# Patient Record
Sex: Male | Born: 1995 | Race: Black or African American | Hispanic: No | Marital: Single | Smoking: Current every day smoker
Health system: Southern US, Community
[De-identification: ages and names within clinical notes are randomized; demographics above are authoritative.]

---
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
## 2016-07-18 ENCOUNTER — Ambulatory Visit (INDEPENDENT_AMBULATORY_CARE_PROVIDER_SITE_OTHER): Payer: Managed Care, Other (non HMO) | Admitting: Emergency Medicine

## 2016-07-18 VITALS — BP 110/58 | HR 61 | Temp 99.0°F | Resp 17 | Ht 68.2 in | Wt 184.0 lb

## 2016-07-18 DIAGNOSIS — J060 Acute laryngopharyngitis: Secondary | ICD-10-CM

## 2016-07-18 DIAGNOSIS — J029 Acute pharyngitis, unspecified: Secondary | ICD-10-CM

## 2016-07-18 LAB — POCT RAPID STREP A (OFFICE): Rapid Strep A Screen: NEGATIVE

## 2016-07-18 MED ORDER — AZITHROMYCIN 250 MG PO TABS: ORAL_TABLET | ORAL | 0 refills | Status: DC

## 2016-07-18 NOTE — Progress Notes (Signed)
Rodney Ferguson is a 21 y.o. male presenting with a sore throat for 2 days.  Associated symptoms include:  fever and headache.  Symptoms are constant.  Home treatment thus far includes:  None.  Known sick contacts with similar symptoms.  There is no history of of similar symptoms. Review of Systems  Constitutional: Positive for chills, fever and malaise/fatigue.  HENT: Positive for congestion and sore throat.   Eyes: Negative for discharge and redness.  Respiratory: Negative for cough, hemoptysis and shortness of breath.   Cardiovascular: Negative for chest pain and palpitations.  Gastrointestinal: Negative for nausea and vomiting.  Genitourinary: Negative for dysuria and hematuria.  Musculoskeletal: Negative for myalgias and neck pain.  Skin: Negative for rash.  Neurological: Negative for dizziness and headaches.  Endo/Heme/Allergies: Does not bruise/bleed easily.   Exam:  BP (!) 110/58 (BP Location: Right Arm, Patient Position: Sitting, Cuff Size: Normal)   Pulse 61   Temp 99 F (37.2 C) (Oral)   Resp 17   Ht 5' 8.2" (1.732 m)   Wt 184 lb (83.5 kg)   SpO2 98%   BMI 27.81 kg/m  Constitutional Physical Exam  Constitutional: He is oriented to person, place, and time. He appears well-developed and well-nourished.  HENT:  Head: Normocephalic and atraumatic.  Mouth/Throat: Uvula is midline. Oropharyngeal exudate, posterior oropharyngeal edema and posterior oropharyngeal erythema present. No tonsillar abscesses.  Eyes: Conjunctivae and EOM are normal. Pupils are equal, round, and reactive to light.  Neck: Normal range of motion. Neck supple.  Cardiovascular: Normal rate, regular rhythm and normal heart sounds.   Pulmonary/Chest: Effort normal and breath sounds normal.  Abdominal: Soft. There is no tenderness.  Musculoskeletal: Normal range of motion.  Lymphadenopathy:    He has cervical adenopathy.  Neurological: He is alert and oriented to person, place, and time.  Skin:  Skin is warm and dry.  Psychiatric: He has a normal mood and affect. His behavior is normal.   A/P: Rodney Ferguson was seen today for sore throat and fever.  Diagnoses and all orders for this visit:  Acute pharyngitis, unspecified etiology -     POCT rapid strep A -     Culture, Group A Strep -     azithromycin (ZITHROMAX) 250 MG tablet; Sig as indicated  Sore throat and laryngitis    Patient Instructions       IF you received an x-ray today, you will receive an invoice [facility]. Please contact [facility] at [phone] with questions or concerns regarding your invoice.   IF you received labwork today, you will receive an invoice from [facility]. Please contact [facility] at [phone] with questions or concerns regarding your invoice.   Our billing staff will not be able to assist you with questions regarding bills from these companies.  You will be contacted with the lab results as soon as they are available. The fastest way to get your results is to activate your My Chart account. Instructions are located on the last page of this paperwork. If you have not heard from us regarding the results in 2 weeks, please contact this office.        Sore Throat When you have a sore throat, your throat may:  Hurt.  Burn.  Feel irritated.  Feel scratchy. Many things can cause a sore throat, including:  An infection.  Allergies.  Dryness in the air.  Smoke or pollution.  Gastroesophageal reflux disease (GERD).  A tumor. A sore throat can be the first sign  of another sickness. It can happen with other problems, like coughing or a fever. Most sore throats go away without treatment. Follow these instructions at home:  Take over-the-counter medicines only as told by your doctor.  Drink enough fluids to keep your pee (urine) clear or pale yellow.  Rest when you feel you need to.  To help with pain, try:  Sipping warm liquids, such as broth,  herbal tea, or warm water.  Eating or drinking cold or frozen liquids, such as frozen ice pops.  Gargling with a salt-water mixture 3-4 times a day or as needed. To make a salt-water mixture, add -1 tsp of salt in 1 cup of warm water. Mix it until you cannot see the salt anymore.  Sucking on hard candy or throat lozenges.  Putting a cool-mist humidifier in your bedroom at night.  Sitting in the bathroom with the door closed for 5-10 minutes while you run hot water in the shower.  Do not use any tobacco products, such as cigarettes, chewing tobacco, and e-cigarettes. If you need help quitting, ask your doctor. Contact a doctor if:  You have a fever for more than 2-3 days.  You keep having symptoms for more than 2-3 days.  Your throat does not get better in 7 days.  You have a fever and your symptoms suddenly get worse. Get help right away if:  You have trouble breathing.  You cannot swallow fluids, soft foods, or your saliva.  You have swelling in your throat or neck that gets worse.  You keep feeling like you are going to throw up (vomit).  You keep throwing up. This information is not intended to replace advice given to you by your health care provider. Make sure you discuss any questions you have with your health care provider. Document Released: 03/07/2008 Document Revised: 01/23/2016 Document Reviewed: 03/19/2015 Elsevier Interactive Patient Education  2017 [employer].

## 2016-07-18 NOTE — Patient Instructions (Addendum)
     IF you received an x-ray today, you will receive an invoice from [facility]. Please contact [facility] at [phone] with questions or concerns regarding your invoice.   IF you received labwork today, you will receive an invoice from [facility]. Please contact [facility] at [phone] with questions or concerns regarding your invoice.   Our billing staff will not be able to assist you with questions regarding bills from these companies.  You will be contacted with the lab results as soon as they are available. The fastest way to get your results is to activate your My Chart account. Instructions are located on the last page of this paperwork. If you have not heard from us regarding the results in 2 weeks, please contact this office.      Sore Throat When you have a sore throat, your throat may:  Hurt.  Burn.  Feel irritated.  Feel scratchy. Many things can cause a sore throat, including:  An infection.  Allergies.  Dryness in the air.  Smoke or pollution.  Gastroesophageal reflux disease (GERD).  A tumor. A sore throat can be the first sign of another sickness. It can happen with other problems, like coughing or a fever. Most sore throats go away without treatment. Follow these instructions at home:  Take over-the-counter medicines only as told by your doctor.  Drink enough fluids to keep your pee (urine) clear or pale yellow.  Rest when you feel you need to.  To help with pain, try:  Sipping warm liquids, such as broth, herbal tea, or warm water.  Eating or drinking cold or frozen liquids, such as frozen ice pops.  Gargling with a salt-water mixture 3-4 times a day or as needed. To make a salt-water mixture, add -1 tsp of salt in 1 cup of warm water. Mix it until you cannot see the salt anymore.  Sucking on hard candy or throat lozenges.  Putting a cool-mist humidifier in your bedroom at night.  Sitting in the bathroom with the door  closed for 5-10 minutes while you run hot water in the shower.  Do not use any tobacco products, such as cigarettes, chewing tobacco, and e-cigarettes. If you need help quitting, ask your doctor. Contact a doctor if:  You have a fever for more than 2-3 days.  You keep having symptoms for more than 2-3 days.  Your throat does not get better in 7 days.  You have a fever and your symptoms suddenly get worse. Get help right away if:  You have trouble breathing.  You cannot swallow fluids, soft foods, or your saliva.  You have swelling in your throat or neck that gets worse.  You keep feeling like you are going to throw up (vomit).  You keep throwing up. This information is not intended to replace advice given to you by your health care provider. Make sure you discuss any questions you have with your health care provider. Document Released: 03/07/2008 Document Revised: 01/23/2016 Document Reviewed: 03/19/2015 Elsevier Interactive Patient Education  2017 [employer]  

## 2016-07-20 LAB — CULTURE, GROUP A STREP

## 2017-05-29 ENCOUNTER — Encounter (HOSPITAL_COMMUNITY): Payer: Self-pay | Admitting: Emergency Medicine

## 2017-05-29 ENCOUNTER — Other Ambulatory Visit: Payer: Self-pay

## 2017-05-29 DIAGNOSIS — M25511 Pain in right shoulder: Secondary | ICD-10-CM | POA: Diagnosis not present

## 2017-05-29 DIAGNOSIS — M542 Cervicalgia: Secondary | ICD-10-CM | POA: Insufficient documentation

## 2017-05-29 DIAGNOSIS — Y9389 Activity, other specified: Secondary | ICD-10-CM | POA: Insufficient documentation

## 2017-05-29 DIAGNOSIS — Y998 Other external cause status: Secondary | ICD-10-CM | POA: Diagnosis not present

## 2017-05-29 DIAGNOSIS — Y92410 Unspecified street and highway as the place of occurrence of the external cause: Secondary | ICD-10-CM | POA: Insufficient documentation
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
## 2017-05-30 ENCOUNTER — Emergency Department (HOSPITAL_COMMUNITY)
Admission: EM | Admit: 2017-05-30 | Discharge: 2017-05-30 | Disposition: A | Discharge status: Home / Self Care | Payer: Managed Care, Other (non HMO) | Attending: Emergency Medicine | Admitting: Emergency Medicine

## 2017-05-30 DIAGNOSIS — M542 Cervicalgia: Secondary | ICD-10-CM

## 2017-05-30 MED ORDER — METHOCARBAMOL 500 MG PO TABS: 500.0000 mg | ORAL_TABLET | Freq: Two times a day (BID) | ORAL | 0 refills | Status: DC

## 2017-05-30 MED ORDER — METHOCARBAMOL 500 MG PO TABS
500.0000 mg | ORAL_TABLET | Freq: Once | ORAL | Status: AC
  Administered 2017-05-30: 500 mg via ORAL
  Filled 2017-05-30: qty 1

## 2017-05-30 MED ORDER — IBUPROFEN 800 MG PO TABS
800.0000 mg | ORAL_TABLET | Freq: Once | ORAL | Status: AC
  Administered 2017-05-30: 800 mg via ORAL
  Filled 2017-05-30: qty 1

## 2017-05-30 MED ORDER — IBUPROFEN 800 MG PO TABS: 800.0000 mg | ORAL_TABLET | Freq: Three times a day (TID) | ORAL | 0 refills | Status: DC
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
## 2017-05-30 NOTE — ED Notes (Signed)
Pt verbalizes understanding of d/c instructions. Pt received prescriptions. Pt ambulatory at d/c with all belongings.  

## 2017-05-30 NOTE — ED Notes (Signed)
ED Provider at bedside. 

## 2017-05-30 NOTE — ED Notes (Signed)
Placed collar on Pt.

## 2017-05-30 NOTE — ED Notes (Signed)
See EDP assessment 
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
## 2018-02-27 ENCOUNTER — Encounter (HOSPITAL_COMMUNITY): Payer: Self-pay | Admitting: Emergency Medicine

## 2018-02-27 ENCOUNTER — Other Ambulatory Visit: Payer: Self-pay

## 2018-02-27 ENCOUNTER — Emergency Department (HOSPITAL_COMMUNITY): Payer: Managed Care, Other (non HMO)

## 2018-02-27 ENCOUNTER — Emergency Department (HOSPITAL_COMMUNITY)
Admission: EM | Admit: 2018-02-27 | Discharge: 2018-02-28 | Disposition: A | Discharge status: Home / Self Care | Payer: Managed Care, Other (non HMO) | Attending: Emergency Medicine | Admitting: Emergency Medicine

## 2018-02-27 DIAGNOSIS — W228XXA Striking against or struck by other objects, initial encounter: Secondary | ICD-10-CM | POA: Insufficient documentation

## 2018-02-27 DIAGNOSIS — F17200 Nicotine dependence, unspecified, uncomplicated: Secondary | ICD-10-CM | POA: Insufficient documentation

## 2018-02-27 DIAGNOSIS — S62366A Nondisplaced fracture of neck of fifth metacarpal bone, right hand, initial encounter for closed fracture: Secondary | ICD-10-CM | POA: Diagnosis not present

## 2018-02-27 DIAGNOSIS — Z79899 Other long term (current) drug therapy: Secondary | ICD-10-CM | POA: Insufficient documentation

## 2018-02-27 DIAGNOSIS — Y939 Activity, unspecified: Secondary | ICD-10-CM | POA: Diagnosis not present

## 2018-02-27 DIAGNOSIS — Y999 Unspecified external cause status: Secondary | ICD-10-CM | POA: Diagnosis not present

## 2018-02-27 DIAGNOSIS — Y929 Unspecified place or not applicable: Secondary | ICD-10-CM | POA: Insufficient documentation

## 2018-02-27 DIAGNOSIS — S6991XA Unspecified injury of right wrist, hand and finger(s), initial encounter: Secondary | ICD-10-CM | POA: Diagnosis present

## 2018-02-27 NOTE — ED Notes (Signed)
Ortho tech paged for ulnar gutter splint placement
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
## 2018-02-28 NOTE — Progress Notes (Signed)
Orthopedic Tech Progress Note Patient Details:  Rodney Ferguson 06/23/1995 [id number]  Ortho Devices Type of Ortho Device: Arm sling, Ulna gutter splint Ortho Device/Splint Location: rue Ortho Device/Splint Interventions: Ordered, Application, Adjustment   Post Interventions Patient Tolerated: Well Instructions Provided: Care of device, Adjustment of device   [name], [name] 02/28/2018, 12:13 AM

## 2018-02-28 NOTE — ED Notes (Signed)
Please see provider note for further evaluation and assessment

## 2018-02-28 NOTE — Discharge Instructions (Signed)
Please return to the Emergency Department for any new or worsening symptoms or if your symptoms do not improve. Please be sure to follow up with your Primary Care Physician as soon as possible regarding your visit today. If you do not have a Primary Doctor please use the resources below to establish one. Please follow-up with the hand surgeon, Dr. Mina Marble as soon as possible regarding your hand fracture.  Contact a health care provider if: Your pain is getting worse. You have redness, swelling, or pain in the injured area. You have fluid, blood, or pus coming from under your cast or splint. You notice a bad smell coming from under your cast or splint. You have a fever. Your cast or splint feels too tight or too loose. You cast is coming apart. Get help right away if: You develop a rash. You have trouble breathing. Your skin or nails on your injured hand turn blue or gray even after you loosen your splint. Your injured hand feels cold or becomes numb even after you loosen your splint. You develop severe pain under the cast or in your hand.  Do not take your medicine if  develop an itchy rash, swelling in your mouth or lips, or difficulty breathing.   RESOURCE GUIDE  Chronic Pain Problems: Contact [facility]  [phone] Patients need to be referred by their primary care doctor.  Insufficient Money for Medicine: Contact United Way:  call "211" or [facility] (819)748-8523.  No Primary Care Doctor: Call [facility]  [phone] - can help you locate a primary care doctor that  accepts your insurance, provides certain services, etc. Physician Referral Service- [phone]  Agencies that provide inexpensive medical care: [facility]  [phone] [facility]  [phone] [facility]  [phone] [facility]  [phone] Planned Parenthood  [phone] [facility]  [phone]  Medicaid-accepting [facility] Providers: [facility]- [address], [address]  [phone], Mon-Fri 9am-7pm, Sat 9am-1pm [facility]- [address] [place], Suite [place]  [phone] [facility]- [address], Suite [place]  [phone] [facility]- [address]  [phone] Renaye Rakers- [address] [place], [address], [phone]  Only accepts [place] patients after they have their name  applied to their card  Self Pay (no insurance) in [facility]: Sickle Cell Patients: Dr Willey Blade, [facility]  [address] [place], [phone] [facility]- [address] [place]  [phone]       [facility] [facility]- [address]       -     [facility]- see information above (Speak to [employer] if you do not have insurance)       -  [facility]- [address] [place], [phone]       -  [facility]- [address],  [phone]       -  [facility]- [address], [phone]       -  Dr Julio Sicks-  [address], [place], [phone]       -  [facility]- [address] [phone]       -  [facility]- [address], [phone], also [address] [phone]       -  [facility]- [address] [place], [phone], 1st & 3rd Saturday   every month, 10am-1pm  1) Find a Doctor and Pay Out of Pocket Although you won't have to find out who is covered by your insurance plan, it is a good idea to ask around and get recommendations. You will then need to call the office and see if the doctor you have chosen will accept you as a new patient and what types of options they offer for patients who are self-pay. Some doctors offer discounts or will set up payment plans for their patients who do not have insurance, but you will need to ask so you aren't surprised when you get to your appointment.  2)  Contact Your Local Health Department Not all health departments have doctors that can see patients for sick visits, but many do, so it is worth a call to see if yours does. If you don't know where your local health department is, you can check in your phone book. The CDC also has a tool to help you locate your state's health department, and many state websites also have listings of all of their local health departments.  3) Find a Walk-in Clinic If your illness is not likely to be very severe or complicated, you may want to try a walk in clinic. These are popping up all over the country in pharmacies, drugstores, and shopping centers. They're usually staffed by nurse practitioners or physician assistants that have been trained to treat common illnesses and complaints. They're usually fairly quick and inexpensive. However, if you have serious medical issues or chronic medical problems, these are probably not your best option  STD Testing [facility] Department of [facility] [place] [facility], STD Clinic, [address], [place], phone [phone] or [phone].  Monday - Friday, call for an appointment. [facility] Department of [employer], STD Clinic, [address], [place], phone [phone] or [phone].  Monday - Friday, call for an appointment.  Abuse/Neglect: [facility] Child Abuse Hotline (986)609-6265 [facility] Child Abuse Hotline (684)372-8580 (After Hours)  Emergency Shelter:  [facility] 947-173-7912  Maternity Homes: [facility] (832)814-6160 [facility] [phone]  MRSA Hotline #:   8180793338  [facility] Resources  [facility] [place]  United Way [facility] Dept. [address]                 [address]         [address] [place] [address]  Blondell Reveal Phone:  [phone]                                  Phone:  [phone]                   Phone:  [phone]  [facility], [phone] [facility] [employer]([phone]       -  [facility] in [place], [address],                                  [phone], [facility]  [place] Child Abuse Hotline 906-424-9688(336) 9478413551 or 901-877-5403(336) 3347070090 (After Hours)   Behavioral Health Services  Substance Abuse Resources: [facility] Drug [facility]  [phone] [phone] [facility] [phone] [facility] [phone] [facility] [phone] Residential & Outpatient Substance Abuse Program  [phone] [phone]  Psychological Services: [facility]  [phone] [facility]  [phone] [facility], [phone] [place] [address], [place], ACCESS LINE: [phone] or [phone], [email or web address]  Dental Assistance  If unable to pay or uninsured, contact:  [facility] or [facility] to become qualified for the adult dental clinic.  Patients with Medicaid: [facility] [phone], [phone] [address], [phone]  If unable to pay, or uninsured, contact [facility] [phone]) or [facility] [phone] in [place], [phone] in [facility]) to become qualified for the adult dental clinic   Other Low-Cost Community Dental Services: [facility]- [address], [place], [place], [phone], [phone], [address]. [address], 2nd and 4th Thursday of the month at 6:30am.  10 clients each day by appointment, can sometimes see walk-in patients if someone does not show for an appointment. [facility]- [address], [place], [place], [phone], [phone] [facility]- [address], [place], [place], [address], [phone] [facility]  Department- [phone] [facility]- [phone] [phone]- [phone]

## 2018-02-28 NOTE — ED Provider Notes (Signed)
[facility] EMERGENCY DEPARTMENT Provider Note   CSN: [id number] Arrival date & time: 02/27/18  2112     History   Chief Complaint Chief Complaint  Patient presents with  . Hand Injury    HPI Rodney Ferguson is a 22 y.o. male presenting for right hand swelling.  Patient states that 10 days ago he struck a boxing bag incorrectly causing pain and swelling to the ulnar side of his right hand.  Patient describes his pain as a constant throbbing pain that was worse with movement of the hand and palpation to the area that lasted approximately 3-4 days.  Patient states that pain has dissipated however he still has a mild amount of swelling to the ulnar side of his right hand.  Patient did not take anything for his pain and denies any and all pain at this time.  HPI  History reviewed. No pertinent past medical history.  Patient Active Problem List   Diagnosis Date Noted  . Acute pharyngitis 07/18/2016    Past Surgical History:  Procedure Laterality Date  . KNEE SURGERY Right 2015        Home Medications    Prior to Admission medications   Medication Sig Start Date End Date Taking? Authorizing Provider  azithromycin (ZITHROMAX) 250 MG tablet Sig as indicated 07/18/16   Georgina Quint, MD  ibuprofen (ADVIL,MOTRIN) 800 MG tablet Take 1 tablet (800 mg total) by mouth 3 (three) times daily. 05/30/17   Garlon Hatchet, PA-C  methocarbamol (ROBAXIN) 500 MG tablet Take 1 tablet (500 mg total) by mouth 2 (two) times daily. 05/30/17   Garlon Hatchet, PA-C    Family History Family History  Problem Relation Age of Onset  . Stroke Maternal Grandfather     Social History Social History   Tobacco Use  . Smoking status: Current Every Day Smoker  . Smokeless tobacco: Never Used  Substance Use Topics  . Alcohol use: No  . Drug use: Yes    Types: Marijuana     Allergies   Patient has no known allergies.   Review of Systems Review of Systems    Constitutional: Negative.  Negative for chills, fatigue and fever.  Musculoskeletal: Positive for arthralgias and joint swelling.  Skin: Negative for color change and wound.  Neurological: Negative.  Negative for weakness and numbness.     Physical Exam Updated Vital Signs BP 109/61 (BP Location: Right Arm)   Pulse (!) 56   Temp 98.1 F (36.7 C) (Oral)   Resp 18   Ht 5\' 8"  (1.727 m)   Wt 86.2 kg   SpO2 98%   BMI 28.89 kg/m   Physical Exam  Constitutional: He appears well-developed and well-nourished. No distress.  HENT:  Head: Normocephalic and atraumatic.  Right Ear: External ear normal.  Left Ear: External ear normal.  Nose: Nose normal.  Eyes: Pupils are equal, round, and reactive to light. EOM are normal.  Neck: Trachea normal and normal range of motion. No tracheal deviation present.  Pulmonary/Chest: Effort normal. No respiratory distress.  Abdominal: Soft. There is no tenderness. There is no rebound and no guarding.  Musculoskeletal: Normal range of motion.       Right elbow: Normal.      Right wrist: Normal.       Right hand: He exhibits deformity. He exhibits normal range of motion, no tenderness, no bony tenderness and normal capillary refill. Normal sensation noted. Normal strength noted.  Right hand: With deformity  to 5th MCP joint. 5th MCP joint is set back into hand consistent with boxer's fracture., skin intact. No tenderness to palpation of the entire hand/wrist. No snuffbox tenderness to palpation. No tenderness to palpation over flexor sheath.  Finger adduction/abduction intact with 5/5 strength.  Thumb opposition intact. Full active and resisted ROM to flexion/extension at wrist, MCP, PIP and DIP of all fingers.  FDS/FDP intact. Grip 5/5 strength.  Radial artery 2+ with <2sec cap refill in all fingers.  Sensation intact to light-tough in median/ulnar/radial distributions.   Neurological: He is alert. He has normal strength. No sensory deficit. GCS eye  subscore is 4. GCS verbal subscore is 5. GCS motor subscore is 6.  Skin: Skin is warm and dry. Capillary refill takes less than 2 seconds. No erythema.  Psychiatric: He has a normal mood and affect. His behavior is normal.    ED Treatments / Results  Labs (all labs ordered are listed, but only abnormal results are displayed) Labs Reviewed - No data to display  EKG None  Radiology Dg Hand Complete Right  Result Date: 02/27/2018 CLINICAL DATA:  Fourth and fifth metacarpophalangeal joint pain and swelling after punching a punching bag. EXAM: RIGHT HAND - COMPLETE 3+ VIEW COMPARISON:  None. FINDINGS: Acute, closed, volar and radial angulated fracture of the distal right fifth metacarpal. No intra-articular extension fracture noted. No joint dislocation. Carpal rows, distal radius as well as ulna appear intact. Soft tissue swelling is noted over the dorsum of the hand. IMPRESSION: Acute volar and radial angulated fracture of the distal right fifth metacarpal. Electronically Signed   By: Tollie Ethavid  Kwon M.D.   On: 02/27/2018 22:21    Procedures Procedures (including critical care time)  Medications Ordered in ED Medications - No data to display   Initial Impression / Assessment and Plan / ED Course  I have reviewed the triage vital signs and the nursing notes.  Pertinent labs & imaging results that were available during my care of the patient were reviewed by me and considered in my medical decision making (see chart for details).    Patient presenting 10 days after fracturing his right hand.  Patient with acute volar and radial angular fracture of the distal right fifth metacarpal.  Patient with obvious deformity to right hand consistent with boxer's fracture.  Patient with minimal swelling to the area.  No increased warmth or erythema.  No signs of infection.  Patient with full range of motion and strength to the right hand.  Patient with intact capillary refill to all fingers and sensation  to light touch present to all aspects of the hand and all fingers.  Patient is not endorsing any pain on my examination.  Patient has been placed in ulnar gutter splint in department and encouraged to follow-up with hand surgeon as soon as possible.  Patient is afebrile, not tachycardic, not hypotensive, well-appearing and in no acute distress.  Patient states that he is not having pain and does not wish to be prescribed pain medicines at this time.  Case discussed with Dr. Rush Landmarkegeler who agrees that ulnar-gutter splint and outpatient hand surgery follow-up is appropriate at this time. Patient reassessed after splint application, neurovascularly intact to all fingers, still not endorsing any pain.  At this time there does not appear to be any evidence of an acute emergency medical condition and the patient appears stable for discharge with appropriate outpatient follow up. Diagnosis was discussed with patient who verbalizes understanding of care plan and is agreeable  to discharge. I have discussed return precautions with patient who verbalizes understanding of return precautions. Patient strongly encouraged to follow-up with their PCP and hand ortho. All questions answered.  Patient's case discussed with Dr. Rush Landmark who agrees with plan to discharge with follow-up.     Note: Portions of this report may have been transcribed using voice recognition software. Every effort was made to ensure accuracy; however, inadvertent computerized transcription errors may still be present.  Final Clinical Impressions(s) / ED Diagnoses   Final diagnoses:  Closed nondisplaced fracture of neck of fifth metacarpal bone of right hand, initial encounter    ED Discharge Orders    None       Elizabeth Palau 02/28/18 0050    Tegeler, Canary Brim, MD 02/28/18 [phone]

## 2018-03-25 ENCOUNTER — Emergency Department (HOSPITAL_COMMUNITY)
Admission: EM | Admit: 2018-03-25 | Discharge: 2018-03-25 | Disposition: A | Discharge status: Home / Self Care | Payer: Managed Care, Other (non HMO) | Attending: Emergency Medicine | Admitting: Emergency Medicine

## 2018-03-25 ENCOUNTER — Emergency Department (HOSPITAL_COMMUNITY): Payer: Managed Care, Other (non HMO)

## 2018-03-25 ENCOUNTER — Encounter (HOSPITAL_COMMUNITY): Payer: Self-pay | Admitting: Emergency Medicine

## 2018-03-25 DIAGNOSIS — R079 Chest pain, unspecified: Secondary | ICD-10-CM

## 2018-03-25 DIAGNOSIS — J181 Lobar pneumonia, unspecified organism: Secondary | ICD-10-CM | POA: Diagnosis not present

## 2018-03-25 DIAGNOSIS — J029 Acute pharyngitis, unspecified: Secondary | ICD-10-CM

## 2018-03-25 DIAGNOSIS — J189 Pneumonia, unspecified organism: Secondary | ICD-10-CM

## 2018-03-25 DIAGNOSIS — F17210 Nicotine dependence, cigarettes, uncomplicated: Secondary | ICD-10-CM | POA: Diagnosis not present

## 2018-03-25 LAB — BASIC METABOLIC PANEL
Anion gap: 9 (ref 5–15)
BUN: 10 mg/dL (ref 6–20)
CALCIUM: 9.9 mg/dL (ref 8.9–10.3)
CO2: 26 mmol/L (ref 22–32)
CREATININE: 1.08 mg/dL (ref 0.61–1.24)
Chloride: 103 mmol/L (ref 98–111)
GLUCOSE: 95 mg/dL (ref 70–99)
POTASSIUM: 4.4 mmol/L (ref 3.5–5.1)
SODIUM: 138 mmol/L (ref 135–145)

## 2018-03-25 LAB — I-STAT TROPONIN, ED
TROPONIN I, POC: 0 ng/mL (ref 0.00–0.08)
Troponin i, poc: 0 ng/mL (ref 0.00–0.08)

## 2018-03-25 LAB — CBC
HCT: 49.8 % (ref 39.0–52.0)
Hemoglobin: 16.2 g/dL (ref 13.0–17.0)
MCH: 27.4 pg (ref 26.0–34.0)
MCHC: 32.5 g/dL (ref 30.0–36.0)
MCV: 84.1 fL (ref 80.0–100.0)
PLATELETS: 230 10*3/uL (ref 150–400)
RBC: 5.92 MIL/uL — ABNORMAL HIGH (ref 4.22–5.81)
RDW: 12.8 % (ref 11.5–15.5)
WBC: 6.7 10*3/uL (ref 4.0–10.5)
nRBC: 0 % (ref 0.0–0.2)

## 2018-03-25 MED ORDER — IOPAMIDOL (ISOVUE-370) INJECTION 76%
100.0000 mL | Freq: Once | INTRAVENOUS | Status: AC | PRN
  Administered 2018-03-25: 100 mL via INTRAVENOUS

## 2018-03-25 MED ORDER — AZITHROMYCIN 250 MG PO TABS: ORAL_TABLET | ORAL | 0 refills | Status: DC

## 2018-03-25 MED ORDER — KETOROLAC TROMETHAMINE 15 MG/ML IJ SOLN
15.0000 mg | Freq: Once | INTRAMUSCULAR | Status: AC
  Administered 2018-03-25: 15 mg via INTRAVENOUS
  Filled 2018-03-25: qty 1

## 2018-03-25 NOTE — ED Notes (Signed)
Patient verbalizes understanding of discharge instructions. Opportunity for questioning and answers were provided. Ambulatory at discharge in NAD.  

## 2018-03-25 NOTE — ED Triage Notes (Signed)
Pt states he got finished working out and began having left sided chest pain, none radiating and no associated symptoms. Pain currently 7/10.

## 2018-03-25 NOTE — ED Provider Notes (Signed)
[facility] EMERGENCY DEPARTMENT Provider Note   CSN: [id number] Arrival date & time: 03/25/18  1014     History   Chief Complaint Chief Complaint  Patient presents with  . Chest Pain    HPI Rodney Ferguson is a 22 y.o. male.  HPI   30 min after working out developed sharp left sided chest pain, worse with deep breaths. Does not feel work out was more intense than prior.  No lightheadedness, no nausea or vomiting.  No diaphoresis.  No leg pain or swelling, no long trips car or airplane, no recent surgeries.  Fever last week and cough, still coughing, no hemoptysis.   History reviewed. No pertinent past medical history.  Patient Active Problem List   Diagnosis Date Noted  . Acute pharyngitis 07/18/2016    Past Surgical History:  Procedure Laterality Date  . KNEE SURGERY Right 2015        Home Medications    Prior to Admission medications   Medication Sig Start Date End Date Taking? Authorizing Provider  ibuprofen (ADVIL,MOTRIN) 800 MG tablet Take 1 tablet (800 mg total) by mouth 3 (three) times daily. Patient taking differently: Take 600 mg by mouth 3 (three) times daily.  05/30/17  Yes Garlon Hatchet, PA-C  azithromycin (ZITHROMAX) 250 MG tablet Sig as indicated 03/25/18   Alvira Monday, MD  methocarbamol (ROBAXIN) 500 MG tablet Take 1 tablet (500 mg total) by mouth 2 (two) times daily. Patient not taking: Reported on 03/25/2018 05/30/17   Garlon Hatchet, PA-C    Family History Family History  Problem Relation Age of Onset  . Stroke Maternal Grandfather     Social History Social History   Tobacco Use  . Smoking status: Current Every Day Smoker  . Smokeless tobacco: Never Used  Substance Use Topics  . Alcohol use: No  . Drug use: Yes    Types: Marijuana     Allergies   Patient has no known allergies.   Review of Systems Review of Systems  Constitutional: Negative for fever.  HENT: Negative for sore throat.   Eyes:  Negative for visual disturbance.  Respiratory: Positive for cough. Negative for shortness of breath.   Cardiovascular: Positive for chest pain.  Gastrointestinal: Negative for abdominal pain, nausea and vomiting.  Genitourinary: Negative for difficulty urinating.  Musculoskeletal: Negative for back pain and neck stiffness.  Skin: Negative for rash.  Neurological: Negative for syncope and headaches.     Physical Exam Updated Vital Signs BP 126/72   Pulse 63   Temp 98.4 F (36.9 C) (Oral)   Resp (!) 21   SpO2 100%   Physical Exam  Constitutional: He is oriented to person, place, and time. He appears well-developed and well-nourished. No distress.  HENT:  Head: Normocephalic and atraumatic.  Eyes: Conjunctivae and EOM are normal.  Neck: Normal range of motion.  Cardiovascular: Normal rate, regular rhythm, normal heart sounds and intact distal pulses. Exam reveals no gallop and no friction rub.  No murmur heard. Pulmonary/Chest: Effort normal and breath sounds normal. No respiratory distress. He has no wheezes. He has no rales.  Abdominal: Soft. He exhibits no distension. There is no tenderness. There is no guarding.  Musculoskeletal: He exhibits no edema.  Neurological: He is alert and oriented to person, place, and time.  Skin: Skin is warm and dry. He is not diaphoretic.  Nursing note and vitals reviewed.    ED Treatments / Results  Labs (all labs ordered are listed,  but only abnormal results are displayed) Labs Reviewed  CBC - Abnormal; Notable for the following components:      Result Value   RBC 5.92 (*)    All other components within normal limits  BASIC METABOLIC PANEL  I-STAT TROPONIN, ED  I-STAT TROPONIN, ED    EKG EKG Interpretation  Date/Time:  Monday March 25 2018 10:17:48 EDT Ventricular Rate:  67 PR Interval:  188 QRS Duration: 88 QT Interval:  366 QTC Calculation: 386 R Axis:   101 Text Interpretation:  Normal sinus rhythm with sinus  arrhythmia Biatrial enlargement Rightward axis Pulmonary disease pattern Abnormal ECG No previous ECGs available Confirmed by Alvira Monday ([id number]) on 03/25/2018 12:10:45 PM   Radiology Dg Chest 2 View  Result Date: 03/25/2018 CLINICAL DATA:  Left-sided chest pain with no associated symptoms following a workout today. Current smoker. No cardiopulmonary history. EXAM: CHEST - 2 VIEW COMPARISON:  None FINDINGS: The lungs are mildly hyperinflated. The perihilar interstitial markings are coarse. There is mild prominence of the left infrahilar soft tissues. The cardiac silhouette is top-normal in size. The pulmonary vascularity is not engorged. The mediastinum is normal in width. The bony thorax exhibits no acute abnormality. IMPRESSION: The lungs are mildly hyperinflated which may be voluntary or may reflect air trapping. Mild interstitial prominence bilaterally may reflect the patient's smoking history. No alveolar pneumonia, pleural effusion, or pneumothorax. Mild soft tissue fullness in the left infrahilar region may reflect normal pulmonary vascularity but is nonspecific. Lymphadenopathy is not excluded. Given the lack of any previous studies and the patient's current symptoms, chest CT scanning would be a useful next imaging step. Electronically Signed   By: David  [place] M.D.   On: 03/25/2018 10:42   Ct Angio Chest Pe W And/or Wo Contrast  Result Date: 03/25/2018 CLINICAL DATA:  Left-sided chest pain. Evaluate for pulmonary embolism. EXAM: CT ANGIOGRAPHY CHEST WITH CONTRAST TECHNIQUE: Multidetector CT imaging of the chest was performed using the standard protocol during bolus administration of intravenous contrast. Multiplanar CT image reconstructions and MIPs were obtained to evaluate the vascular anatomy. CONTRAST:  ISOVUE-370 IOPAMIDOL (ISOVUE-370) INJECTION 76% COMPARISON:  Chest radiograph-earlier same day FINDINGS: Vascular Findings: There is adequate opacification of the pulmonary  arterial system with the main pulmonary artery measuring 332 Hounsfield units. There are no discrete filling defects within the pulmonary arterial tree to suggest pulmonary embolism. Normal caliber the main pulmonary artery. Borderline cardiomegaly.  No pericardial effusion. Normal caliber of the thoracic aorta. No definite evidence of thoracic aortic dissection or periaortic stranding on this nongated examination. Conventional configuration of the aortic arch. The branch vessels of the aortic arch appear patent proximally. Review of the MIP images confirms the above findings. ---------------------------------------------------------------------------------- Nonvascular Findings: Mediastinum/Lymph Nodes: No bulky mediastinal, hilar axillary lymphadenopathy. Ill-defined soft tissue within the anterior mediastinum is favored to represent residual thymic tissue. Lungs/Pleura: Ill-defined ground-glass areas of consolidation within the left lower lobe (representative images 58, 72 and 65 as well as the lingula (image 68, series 6). No pleural effusion or pneumothorax. The central pulmonary airways appear widely patent. No discrete pulmonary nodules given background parenchymal abnormalities. Upper abdomen: Limited early arterial phase evaluation of the upper abdomen suggests an approximately 2.5 cm cyst arising from the superior pole the left kidney, incompletely imaged. Musculoskeletal: No acute or aggressive osseous abnormalities. Regional soft tissues appear normal. IMPRESSION: 1. No evidence of pulmonary embolism. 2. Ill-defined heterogeneous airspace opacities within the left lower lobe and lingula favored to represent areas of developing  multifocal infection, including atypical etiologies, less likely aspiration. Electronically Signed   By: Simonne Come M.D.   On: 03/25/2018 13:53    Procedures Procedures (including critical care time)  Medications Ordered in ED Medications  ketorolac (TORADOL) 15 MG/ML  injection 15 mg (15 mg Intravenous Given 03/25/18 1529)  iopamidol (ISOVUE-370) 76 % injection 100 mL (100 mLs Intravenous Contrast Given 03/25/18 1332)     Initial Impression / Assessment and Plan / ED Course  I have reviewed the triage vital signs and the nursing notes.  Pertinent labs & imaging results that were available during my care of the patient were reviewed by me and considered in my medical decision making (see chart for details).     22 year old male with no significant medical history presents with concern for sharp, pleuritic left-sided chest pain.  EKG shows no signs of pericarditis or acute ST changes.  Chest x-ray was completed which showed mild soft tissue fullness in the left infrahilar region which is nonspecific, possible lymphadenopathy or other.  Given this finding as well as patient's pleuritic sharp chest pain, CT PE study was ordered for further evaluation.  CT PE study shows no sign of pulmonary embolus, however does show lingular findings concerning for pneumonia.  Delta troponins negative. Patient appropriate for outpatient treatment.  He is given prescription for azithromycin, recommend primary care physician follow-up, ibuprofen and Tylenol. Patient discharged in stable condition with understanding of reasons to return.   Final Clinical Impressions(s) / ED Diagnoses   Final diagnoses:  Chest pain, unspecified type  Community acquired pneumonia of left lower lobe of lung [facility])    ED Discharge Orders         Ordered    azithromycin (ZITHROMAX) 250 MG tablet     03/25/18 1546           Alvira Monday, MD 03/26/18 0116

## 2018-04-03 ENCOUNTER — Other Ambulatory Visit: Payer: Self-pay

## 2018-04-03 ENCOUNTER — Ambulatory Visit: Payer: Managed Care, Other (non HMO) | Admitting: Family Medicine

## 2018-04-04 ENCOUNTER — Encounter: Payer: Self-pay | Admitting: Family Medicine

## 2018-04-04 ENCOUNTER — Ambulatory Visit (INDEPENDENT_AMBULATORY_CARE_PROVIDER_SITE_OTHER): Payer: Managed Care, Other (non HMO) | Admitting: Family Medicine

## 2018-04-04 VITALS — BP 103/58 | HR 61 | Temp 98.3°F | Resp 16 | Ht 68.5 in | Wt 200.6 lb

## 2018-04-04 DIAGNOSIS — Z711 Person with feared health complaint in whom no diagnosis is made: Secondary | ICD-10-CM | POA: Diagnosis not present

## 2018-04-04 DIAGNOSIS — Z23 Encounter for immunization: Secondary | ICD-10-CM

## 2018-04-04 DIAGNOSIS — J181 Lobar pneumonia, unspecified organism: Secondary | ICD-10-CM

## 2018-04-04 DIAGNOSIS — J189 Pneumonia, unspecified organism: Secondary | ICD-10-CM | POA: Insufficient documentation

## 2018-04-04 DIAGNOSIS — F17200 Nicotine dependence, unspecified, uncomplicated: Secondary | ICD-10-CM | POA: Diagnosis not present

## 2018-04-04 NOTE — Progress Notes (Signed)
Rodney Ferguson, is a 22 y.o. male  ZDG:[id number]  [id number]:[id number]  DOB - 03-15-1996  CC:  Chief Complaint  Patient presents with  . Establish Care  . Follow-up    ED 10/14 for chest pain & community acquired pneumonia of L lower lobe. prescribed z-pak. states that his symptoms have resolved after completing Rx. no chest pain, SHOB, chest tightness, etc.       HPI: Rodney Ferguson is a 22 y.o. male is here today to establish care.   Fady A Buchinger has Current smoker and Community acquired pneumonia of left lower lobe of lung (HCC) on their problem list.    Today's visit:  Patient recently presented to [facility] ER with a complaint of sharp left-sided chest pain.  CTA was performed to rule out a PE. CTA revealed ground-glass opacities at the left lower lobe of lung. Patient admits to daily marijuana smoking along with vaping.  He was discharged to follow-up and establish here. He was prescribed azithromycin which he reports he completed all of the medication. Denies any pleuritic chest pain, shortness of breath, fever, chills, body aches.  Denies any other chronic problems.  He request STD testing today and would like to get the influenza vaccine.  Patient denies new headaches, chest pain, abdominal pain, nausea, new weakness , numbness or tingling, SOB, edema, or worrisome cough.    Pertinent family medical history: family history includes Stroke in his maternal grandfather.  Aunt diabetes. Mother and Aunt hypertension    Social History   Socioeconomic History  . Marital status: Single    Spouse name: Not on file  . Number of children: Not on file  . Years of education: Not on file  . Highest education level: Not on file  Occupational History  . Not on file  Social Needs  . Financial resource strain: Not on file  . Food insecurity:    Worry: Not on file    Inability: Not on file  . Transportation needs:    Medical: Not on file    Non-medical: Not on file  Tobacco Use   . Smoking status: Current Every Day Smoker  . Smokeless tobacco: Never Used  Substance and Sexual Activity  . Alcohol use: No  . Drug use: Yes    Types: Marijuana  . Sexual activity: Not on file  Lifestyle  . Physical activity:    Days per week: Not on file    Minutes per session: Not on file  . Stress: Not on file  Relationships  . Social connections:    Talks on phone: Not on file    Gets together: Not on file    Attends religious service: Not on file    Active member of club or organization: Not on file    Attends meetings of clubs or organizations: Not on file    Relationship status: Not on file  . Intimate partner violence:    Fear of current or ex partner: Not on file    Emotionally abused: Not on file    Physically abused: Not on file    Forced sexual activity: Not on file  Other Topics Concern  . Not on file  Social History Narrative  . Not on file    Review of Systems: Constitutional: Negative for fever, chills, diaphoresis, activity change, appetite change and fatigue. HENT: Negative for ear pain, nosebleeds, congestion, facial swelling, rhinorrhea, neck pain, neck stiffness and ear discharge.  Eyes: Negative for pain, discharge, redness, itching and visual disturbance.  Respiratory: Negative for cough, choking, chest tightness, shortness of breath, wheezing and stridor.  Cardiovascular: Negative for chest pain, palpitations and leg swelling. Gastrointestinal: Negative for abdominal distention. Genitourinary: Negative for dysuria, urgency, frequency, hematuria, flank pain, decreased urine volume, difficulty urinating. Musculoskeletal: Negative for back pain, joint swelling, arthralgia and gait problem. Neurological: Negative for dizziness, tremors, seizures, syncope, facial asymmetry, speech difficulty, weakness, light-headedness, numbness and headaches.  Hematological: Negative for adenopathy. Does not bruise/bleed easily. Psychiatric/Behavioral: Negative for  hallucinations, behavioral problems, confusion, dysphoric mood, decreased concentration and agitation.   Objective:   Vitals:   04/04/18 0934 04/04/18 1241  BP: (!) 103/58   Pulse: (!) 52 61  Resp: 16   Temp: 98.3 F (36.8 C)   SpO2: 98%     BP Readings from Last 3 Encounters:  04/04/18 (!) 103/58  03/25/18 126/72  02/28/18 109/61    Filed Weights   04/04/18 0934  Weight: 200 lb 9.6 oz (91 kg)      Physical Exam: Constitutional: Patient appears well-developed and well-nourished. No distress. HENT: Normocephalic, atraumatic, External right and left ear normal. Oropharynx is clear and moist.  Eyes: Conjunctivae and EOM are normal. PERRLA, no scleral icterus. Neck: Normal ROM. Neck supple. No JVD. No tracheal deviation. No thyromegaly. CVS: RRR, S1/S2 +, no murmurs, no gallops, no carotid bruit.  Pulmonary: Effort and breath sounds normal, no stridor, rhonchi, wheezes, rales.  Musculoskeletal: Normal range of motion. No edema and no tenderness.  Skin: Skin is warm and dry. No rash noted. Not diaphoretic. No erythema. No pallor. Psychiatric: Normal mood and affect. Behavior, judgment, thought content normal. Lab Results (prior encounters)  Lab Results  Component Value Date   WBC 6.7 03/25/2018   HGB 16.2 03/25/2018   HCT 49.8 03/25/2018   MCV 84.1 03/25/2018   PLT 230 03/25/2018   Lab Results  Component Value Date   CREATININE 1.08 03/25/2018   BUN 10 03/25/2018   NA 138 03/25/2018   K 4.4 03/25/2018   CL 103 03/25/2018   CO2 26 03/25/2018    Assessment and plan:  1. Community acquired pneumonia of left lower lobe of lung (HCC), improving - Will recheck CBC with Differential  2. Concern about STD in male without diagnosis - HIV antibody (with reflex); Future - RPR; Future - GC/Chlamydia Probe Amp([facility])  3. Needs flu shot -Flu given  4. Current smoker -Counseling provided regarding the harms of chronic smoking of marijuana and vaping Patient verbalized  understanding.  Follow-up as needed.   Orders Placed This Encounter  Procedures  . GC/Chlamydia Probe Amp([facility])  . Flu Vaccine QUAD 36+ mos IM (Fluarix & Fluzone Quad PF  . HIV antibody (with reflex)  . CBC with Differential  . RPR    The patient was given clear instructions to go to ER or return to medical center if symptoms don't improve, worsen or new problems develop. The patient verbalized understanding. The patient was advised  to call and obtain lab results if they haven't heard anything from out office within 7-10 business days.  Joaquin Courts, FNP Primary Care at [facility] [address], [place] [address] [phone]: [phone]    This note has been created with Dragon speech recognition software and Paediatric nurse. Any transcriptional errors are unintentional.

## 2018-04-04 NOTE — Patient Instructions (Addendum)
Go to [facility] for labs.  Thank you for choosing Primary Care at [facility] for your medical home!    Rodney Ferguson was seen by Joaquin Courts, FNP today.   Rodney Ferguson's primary care provider  is Bing Neighbors, FNP.   For the best care possible,  you should try to see Joaquin Courts, FNP  whenever you come to clinic.   We look forward to seeing you again soon!  If you have any questions about your visit today,  please call us at   Or feel free to reach your provider via MyChart.

## 2018-04-06 LAB — GC/CHLAMYDIA PROBE AMP
Chlamydia trachomatis, NAA: NEGATIVE
Neisseria gonorrhoeae by PCR: NEGATIVE

## 2018-04-09 NOTE — Progress Notes (Signed)
Patient notified of results. Expressed understanding.

## 2018-05-08 ENCOUNTER — Ambulatory Visit (INDEPENDENT_AMBULATORY_CARE_PROVIDER_SITE_OTHER): Payer: Managed Care, Other (non HMO) | Admitting: Family Medicine

## 2018-05-08 ENCOUNTER — Encounter: Payer: Self-pay | Admitting: Family Medicine

## 2018-05-08 VITALS — BP 103/59 | HR 58 | Resp 17 | Ht 68.5 in | Wt 203.6 lb

## 2018-05-08 DIAGNOSIS — L0291 Cutaneous abscess, unspecified: Secondary | ICD-10-CM | POA: Diagnosis not present

## 2018-05-08 MED ORDER — SULFAMETHOXAZOLE-TRIMETHOPRIM 800-160 MG PO TABS: 1.0000 | ORAL_TABLET | Freq: Two times a day (BID) | ORAL | 0 refills | Status: AC

## 2018-05-08 NOTE — Progress Notes (Signed)
Patient ID: Rodney Ferguson, male    DOB: Nov 27, 1995, 22 y.o.   MRN: [id number]  PCP: Bing Neighbors, FNP  Chief Complaint  Patient presents with  . Ingrown Hair    on abdomen x 2 weeks. states that it's due to him shaving. states that area has stopped draining pus but is still swollen. has been using OTC ointment with no relief.    Subjective:  HPI  Rodney Ferguson is a 22 y.o. male presents for evaluation of an abscess. Abscess is on abdomen. He shaves abdomen several times per week. He has noticed increasing pain and redness over the last 1-2 weeks. Denies chills, fever, nausea, or vomiting.  Social History   Socioeconomic History  . Marital status: Single    Spouse name: Not on file  . Number of children: Not on file  . Years of education: Not on file  . Highest education level: Not on file  Occupational History  . Not on file  Social Needs  . Financial resource strain: Not on file  . Food insecurity:    Worry: Not on file    Inability: Not on file  . Transportation needs:    Medical: Not on file    Non-medical: Not on file  Tobacco Use  . Smoking status: Current Every Day Smoker  . Smokeless tobacco: Never Used  Substance and Sexual Activity  . Alcohol use: No  . Drug use: Yes    Types: Marijuana  . Sexual activity: Not on file  Lifestyle  . Physical activity:    Days per week: Not on file    Minutes per session: Not on file  . Stress: Not on file  Relationships  . Social connections:    Talks on phone: Not on file    Gets together: Not on file    Attends religious service: Not on file    Active member of club or organization: Not on file    Attends meetings of clubs or organizations: Not on file    Relationship status: Not on file  . Intimate partner violence:    Fear of current or ex partner: Not on file    Emotionally abused: Not on file    Physically abused: Not on file    Forced sexual activity: Not on file  Other Topics Concern  . Not on  file  Social History Narrative  . Not on file    Family History  Problem Relation Age of Onset  . Stroke Maternal Grandfather      Review of Systems  Patient Active Problem List   Diagnosis Date Noted  . Current smoker 04/04/2018  . Community acquired pneumonia of left lower lobe of lung (HCC) 04/04/2018    No Known Allergies  Prior to Admission medications   Not on File    Past Medical, Surgical Family and Social History reviewed and updated.    Objective:   Today's Vitals   05/08/18 1416  BP: (!) 103/59  Pulse: (!) 58  Resp: 17  SpO2: 96%  Weight: 203 lb 9.6 oz (92.4 kg)  Height: 5' 8.5" (1.74 m)    Wt Readings from Last 3 Encounters:  05/08/18 203 lb 9.6 oz (92.4 kg)  04/04/18 200 lb 9.6 oz (91 kg)  02/27/18 190 lb (86.2 kg)     Physical Exam General appearance: alert, well developed, well nourished, cooperative and in no distress Head: Normocephalic, without obvious abnormality, atraumatic Respiratory: Respirations even and unlabored, normal respiratory rate  Heart: rate and rhythm normal. No gallop or murmurs noted on exam  Extremities: No gross deformities Skin: lower right abdominal quadrant enlarged, erythema, non-fluctuant abscess. Abscess is tender. Erythema localized to the abscess and doesn't extend site. Psych: Appropriate mood and affect. Neurologic: Mental status: Alert, oriented to person, place, and time, thought content appropriate.   Assessment & Plan:  1. Abscess -will start empiric antibiotics and have patient follow-up in one week to attempt I & D Meds ordered this encounter  Medications  . sulfamethoxazole-trimethoprim (BACTRIM DS,SEPTRA DS) 800-160 MG tablet    Sig: Take 1 tablet by mouth 2 (two) times daily.    Dispense:  20 tablet    Refill:  0   RTC: 1 week for I & D   -The patient was given clear instructions to go to ER or return to medical center if symptoms do not improve, worsen or new problems develop. The patient  verbalized understanding.    Joaquin CourtsKimberly Bracken Moffa, FNP [facility] [address], [place] [id number] [phone]: [phone]

## 2018-05-08 NOTE — Patient Instructions (Signed)
Skin Abscess A skin abscess is an infected area on or under your skin that contains pus and other material. An abscess can happen almost anywhere on your body. Some abscesses break open (rupture) on their own. Most continue to get worse unless they are treated. The infection can spread deeper into the body and into your blood, which can make you feel sick. Treatment usually involves draining the abscess. Follow these instructions at home: Abscess Care  If you have an abscess that has not drained, place a warm, clean, wet washcloth over the abscess several times a day. Do this as told by your doctor.  Follow instructions from your doctor about how to take care of your abscess. Make sure you: ? Cover the abscess with a bandage (dressing). ? Change your bandage or gauze as told by your doctor. ? Wash your hands with soap and water before you change the bandage or gauze. If you cannot use soap and water, use hand sanitizer.  Check your abscess every day for signs that the infection is getting worse. Check for: ? More redness, swelling, or pain. ? More fluid or blood. ? Warmth. ? More pus or a bad smell. Medicines   Take over-the-counter and prescription medicines only as told by your doctor.  If you were prescribed an antibiotic medicine, take it as told by your doctor. Do not stop taking the antibiotic even if you start to feel better. General instructions  To avoid spreading the infection: ? Do not share personal care items, towels, or hot tubs with others. ? Avoid making skin-to-skin contact with other people.  Keep all follow-up visits as told by your doctor. This is important. Contact a doctor if:  You have more redness, swelling, or pain around your abscess.  You have more fluid or blood coming from your abscess.  Your abscess feels warm when you touch it.  You have more pus or a bad smell coming from your abscess.  You have a fever.  Your muscles ache.  You have  chills.  You feel sick. Get help right away if:  You have very bad (severe) pain.  You see red streaks on your skin spreading away from the abscess. This information is not intended to replace advice given to you by your health care provider. Make sure you discuss any questions you have with your health care provider. Document Released: 11/15/2007 Document Revised: 01/23/2016 Document Reviewed: 04/07/2015 Elsevier Interactive Patient Education  2018 [employer]  

## 2018-05-17 ENCOUNTER — Ambulatory Visit: Payer: Managed Care, Other (non HMO) | Admitting: Family Medicine

## 2019-09-01 IMAGING — CT CT ANGIO CHEST
2 of 6 series · 18 of 36 positions shown · IV contrast (iopamidol)
Comparison: Chest radiograph-earlier same day

CLINICAL DATA: Left-sided chest pain. Evaluate for pulmonary
embolism.

EXAM:
CT ANGIOGRAPHY CHEST WITH CONTRAST
TECHNIQUE: Multidetector CT imaging of the chest was performed using the
standard protocol during bolus administration of intravenous
contrast. Multiplanar CT image reconstructions and MIPs were
obtained to evaluate the vascular anatomy.
CONTRAST:  100mL [id number] IOPAMIDOL ([id number]) INJECTION 76%

[Series 7: pe thins · axial · 0.79mm/px · z∈[+1235,+1470]mm · 17 of 373 slices shown]
[im 19/373  lung]
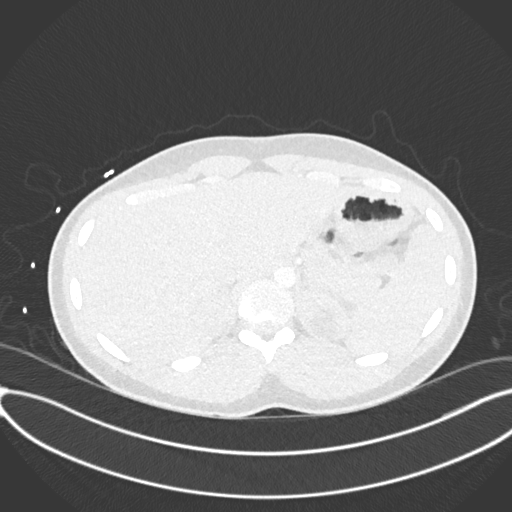
[im 38/373  mediastinal]
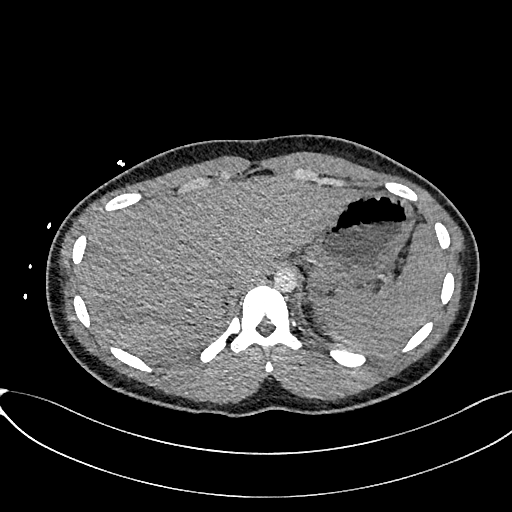
[im 56/373  lung]
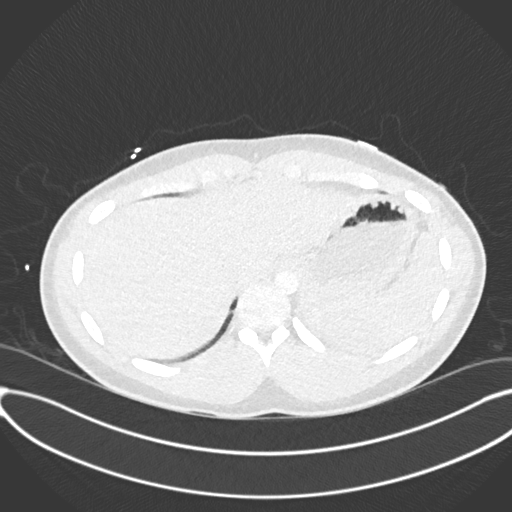
[im 75/373  mediastinal]
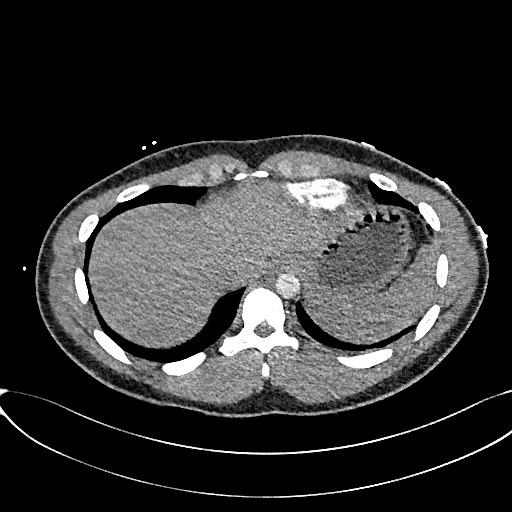
[im 112/373  lung]
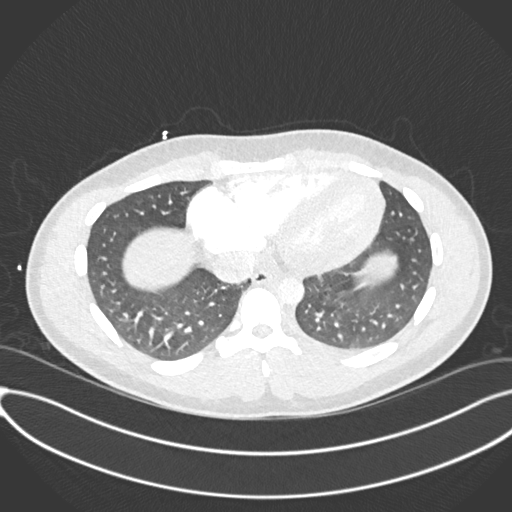
[im 131/373  mediastinal]
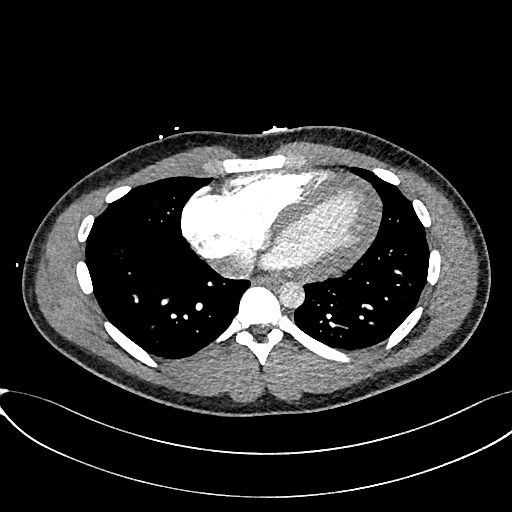
[im 149/373  lung]
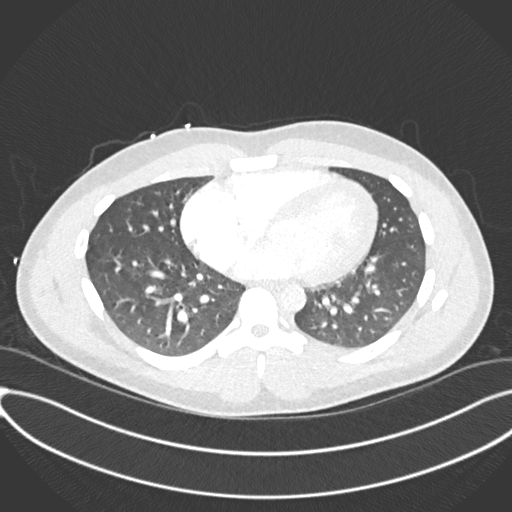
[im 168/373  mediastinal]
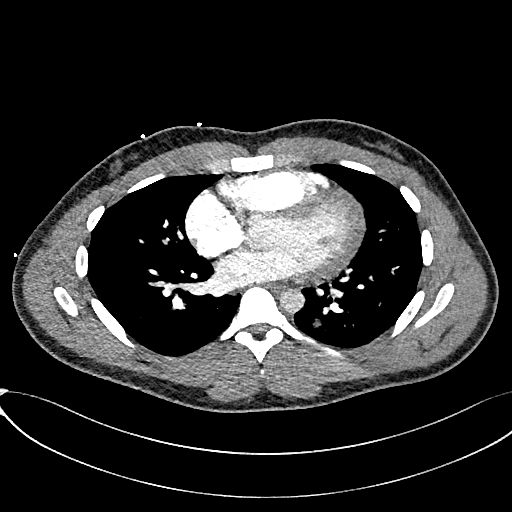
[im 187/373  lung]
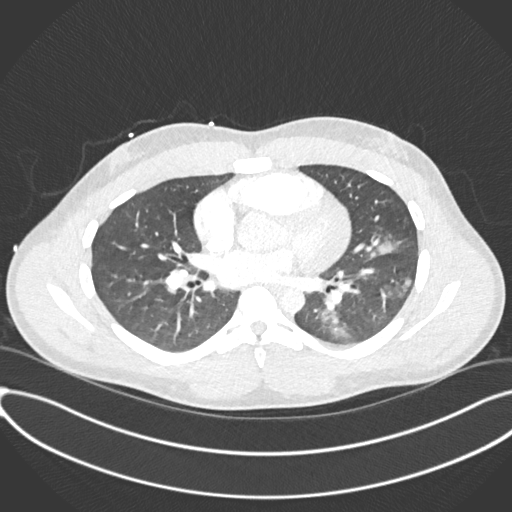
[im 205/373  mediastinal]
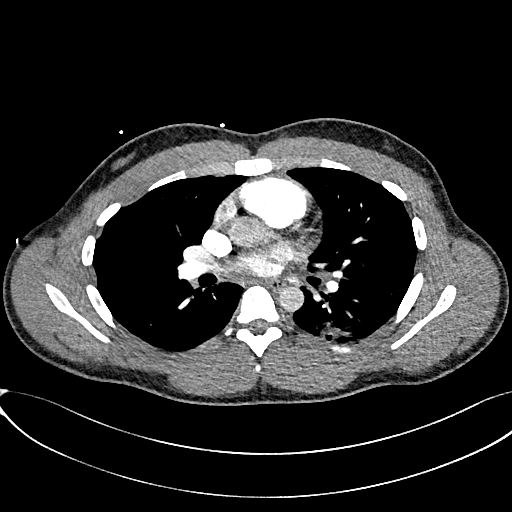
[im 224/373  lung]
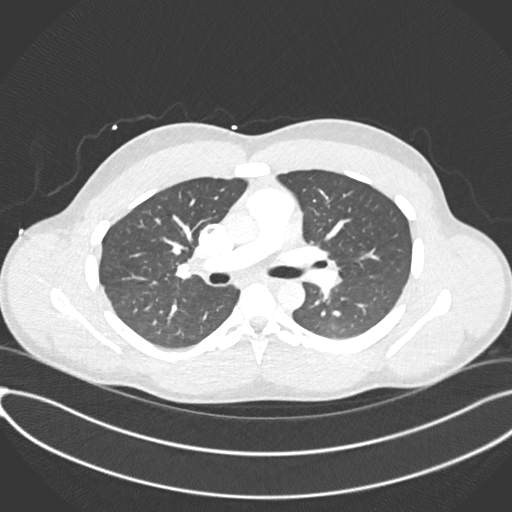
[im 242/373  mediastinal]
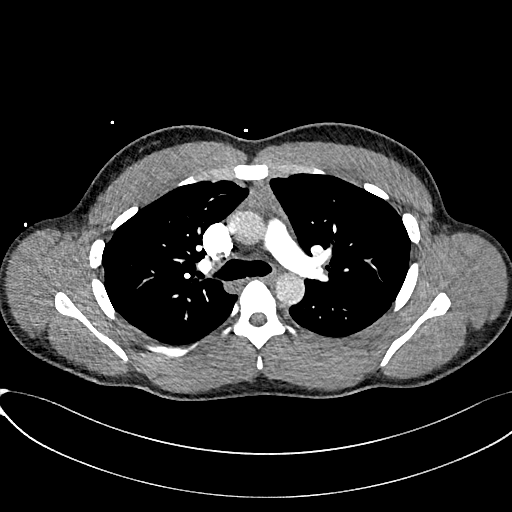
[im 261/373  lung]
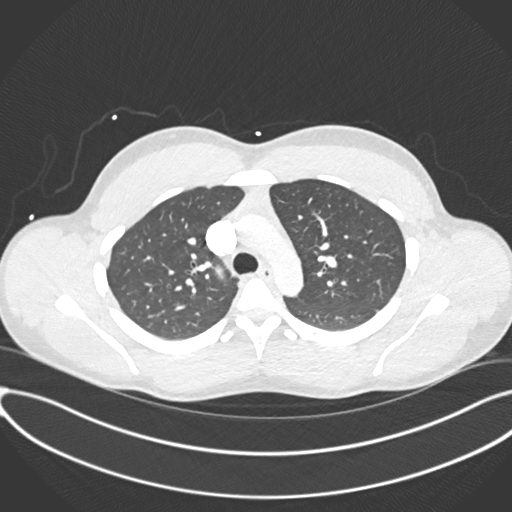
[im 298/373  mediastinal]
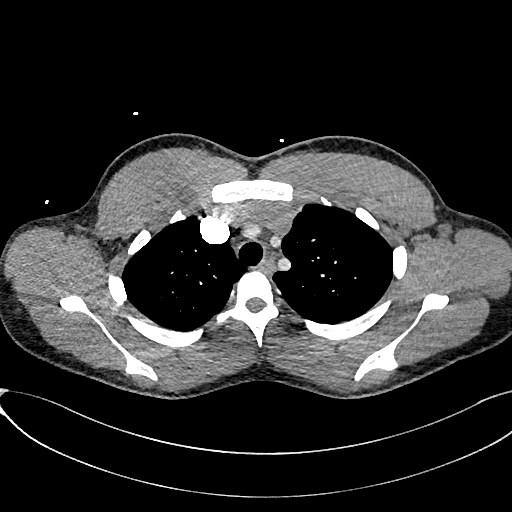
[im 317/373  lung]
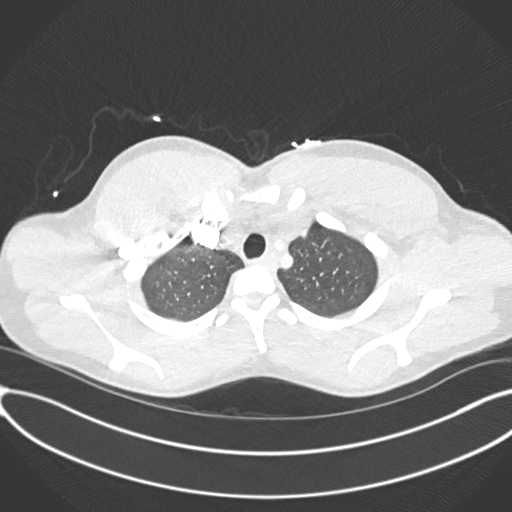
[im 335/373  mediastinal]
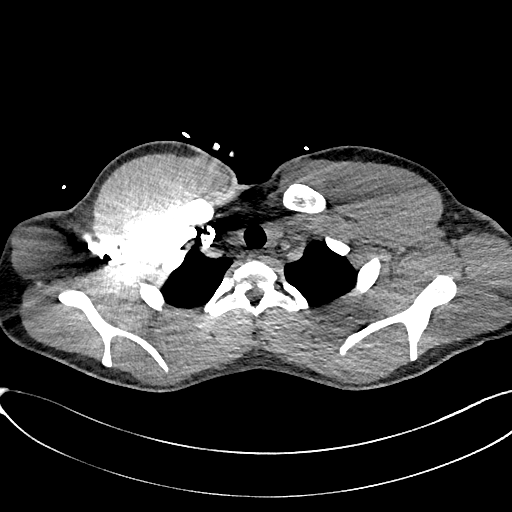
[im 354/373  lung]
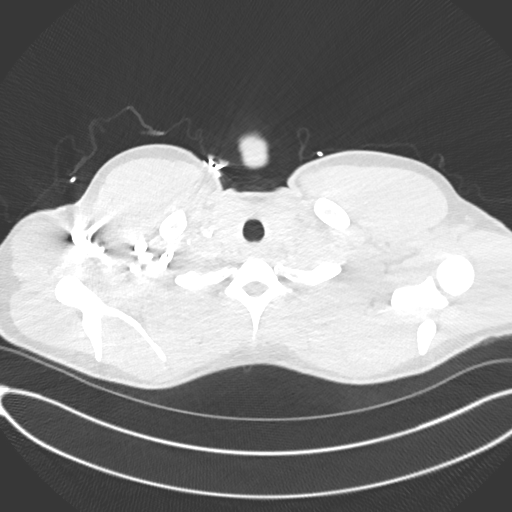

[Series 8: pe 2mm cor · coronal · 0.52mm/px · 1 of 127 slices shown]
[im 64/127  mediastinal]
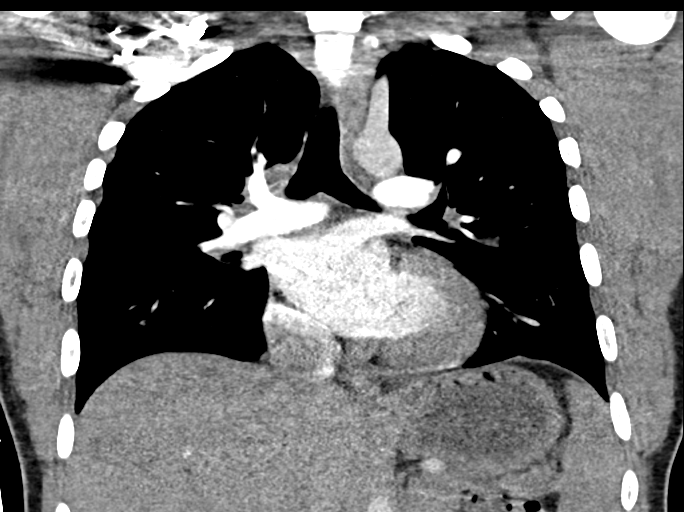

[18 of 36 positions shown; findings below may reference images not displayed]

FINDINGS: Vascular Findings:

There is adequate opacification of the pulmonary arterial system
with the main pulmonary artery measuring 332 Hounsfield units. There
are no discrete filling defects within the pulmonary arterial tree
to suggest pulmonary embolism.

Normal caliber the main pulmonary artery.

Borderline cardiomegaly.  No pericardial effusion.

Normal caliber of the thoracic aorta. No definite evidence of
thoracic aortic dissection or periaortic stranding on this nongated
examination. Conventional configuration of the aortic arch. The
branch vessels of the aortic arch appear patent proximally.

Review of the MIP images confirms the above findings.

----------------------------------------------------------------------------------

Nonvascular Findings:

Mediastinum/Lymph Nodes: No bulky mediastinal, hilar axillary
lymphadenopathy. Ill-defined soft tissue within the anterior
mediastinum is favored to represent residual thymic tissue.

Lungs/Pleura: Ill-defined ground-glass areas of consolidation within
the left lower lobe (representative images 58, 72 and 65 as well as
the lingula (image 68, series 6). No pleural effusion or
pneumothorax. The central pulmonary airways appear widely patent. No
discrete pulmonary nodules given background parenchymal
abnormalities.

Upper abdomen: Limited early arterial phase evaluation of the upper
abdomen suggests an approximately 2.5 cm cyst arising from the
superior pole the left kidney, incompletely imaged.

Musculoskeletal: No acute or aggressive osseous abnormalities.
Regional soft tissues appear normal.
IMPRESSION: 1. No evidence of pulmonary embolism.
2. Ill-defined heterogeneous airspace opacities within the left
lower lobe and lingula favored to represent areas of developing
multifocal infection, including atypical etiologies, less likely
aspiration.
